# Patient Record
Sex: Female | Born: 1960 | Race: Black or African American | Hispanic: No | Marital: Single | Smoking: Current every day smoker
Health system: Southern US, Community
[De-identification: ages and names within clinical notes are randomized; demographics above are authoritative.]

## PROBLEM LIST (undated history)

## (undated) DIAGNOSIS — I10 Essential (primary) hypertension: Secondary | ICD-10-CM

## (undated) HISTORY — DX: Essential (primary) hypertension: I10

---

## 2001-09-20 ENCOUNTER — Emergency Department (HOSPITAL_COMMUNITY): Admission: EM | Admit: 2001-09-20 | Discharge: 2001-09-20 | Payer: Self-pay | Admitting: Emergency Medicine

## 2001-09-20 ENCOUNTER — Encounter: Payer: Self-pay | Admitting: Emergency Medicine

## 2004-03-23 ENCOUNTER — Emergency Department (HOSPITAL_COMMUNITY): Admission: EM | Admit: 2004-03-23 | Discharge: 2004-03-23 | Payer: Self-pay | Admitting: Emergency Medicine

## 2009-10-06 ENCOUNTER — Emergency Department (HOSPITAL_COMMUNITY): Admission: EM | Admit: 2009-10-06 | Discharge: 2009-10-06 | Payer: Self-pay | Admitting: Emergency Medicine

## 2014-07-17 ENCOUNTER — Emergency Department (HOSPITAL_COMMUNITY): Payer: No Typology Code available for payment source

## 2014-07-17 ENCOUNTER — Emergency Department (HOSPITAL_COMMUNITY)
Admission: EM | Admit: 2014-07-17 | Discharge: 2014-07-17 | Disposition: A | Discharge status: Home / Self Care | Payer: No Typology Code available for payment source | Attending: Emergency Medicine | Admitting: Emergency Medicine

## 2014-07-17 ENCOUNTER — Encounter (HOSPITAL_COMMUNITY): Payer: Self-pay | Admitting: Emergency Medicine

## 2014-07-17 DIAGNOSIS — Y998 Other external cause status: Secondary | ICD-10-CM | POA: Diagnosis not present

## 2014-07-17 DIAGNOSIS — M542 Cervicalgia: Secondary | ICD-10-CM

## 2014-07-17 DIAGNOSIS — S0990XA Unspecified injury of head, initial encounter: Secondary | ICD-10-CM | POA: Diagnosis not present

## 2014-07-17 DIAGNOSIS — Y92410 Unspecified street and highway as the place of occurrence of the external cause: Secondary | ICD-10-CM | POA: Insufficient documentation

## 2014-07-17 DIAGNOSIS — Z720 Tobacco use: Secondary | ICD-10-CM | POA: Diagnosis not present

## 2014-07-17 DIAGNOSIS — Y9389 Activity, other specified: Secondary | ICD-10-CM | POA: Diagnosis not present

## 2014-07-17 DIAGNOSIS — S3992XA Unspecified injury of lower back, initial encounter: Secondary | ICD-10-CM | POA: Diagnosis not present

## 2014-07-17 DIAGNOSIS — M545 Low back pain: Secondary | ICD-10-CM

## 2014-07-17 DIAGNOSIS — S199XXA Unspecified injury of neck, initial encounter: Secondary | ICD-10-CM | POA: Diagnosis not present

## 2014-07-17 MED ORDER — OXYCODONE-ACETAMINOPHEN 5-325 MG PO TABS
2.0000 | ORAL_TABLET | ORAL | Status: DC | PRN
Start: 2014-07-17 — End: 2014-07-19

## 2014-07-17 MED ORDER — NAPROXEN 500 MG PO TABS: 500.0000 mg | ORAL_TABLET | Freq: Two times a day (BID) | ORAL | Status: DC

## 2014-07-17 MED ORDER — METHOCARBAMOL 500 MG PO TABS: 500.0000 mg | ORAL_TABLET | Freq: Two times a day (BID) | ORAL | Status: DC

## 2014-07-17 MED ORDER — MORPHINE SULFATE 4 MG/ML IJ SOLN
4.0000 mg | Freq: Once | INTRAMUSCULAR | Status: AC
Start: 2014-07-17 — End: 2014-07-17
  Administered 2014-07-17: 4 mg via INTRAMUSCULAR
  Filled 2014-07-17: qty 1
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
## 2014-07-19 ENCOUNTER — Emergency Department (INDEPENDENT_AMBULATORY_CARE_PROVIDER_SITE_OTHER)
Admission: EM | Admit: 2014-07-19 | Discharge: 2014-07-19 | Disposition: A | Discharge status: Home / Self Care | Payer: Self-pay | Source: Home / Self Care | Attending: Emergency Medicine | Admitting: Emergency Medicine

## 2014-07-19 ENCOUNTER — Encounter (HOSPITAL_COMMUNITY): Payer: Self-pay | Admitting: Emergency Medicine

## 2014-07-19 DIAGNOSIS — S161XXA Strain of muscle, fascia and tendon at neck level, initial encounter: Secondary | ICD-10-CM

## 2014-07-19 DIAGNOSIS — M5441 Lumbago with sciatica, right side: Secondary | ICD-10-CM

## 2014-07-19 MED ORDER — HYDROCODONE-ACETAMINOPHEN 5-325 MG PO TABS
2.0000 | ORAL_TABLET | Freq: Once | ORAL | Status: AC
  Administered 2014-07-19: 2 via ORAL

## 2014-07-19 MED ORDER — OXYCODONE-ACETAMINOPHEN 5-325 MG PO TABS: 1.0000 | ORAL_TABLET | ORAL | Status: DC | PRN

## 2014-07-19 MED ORDER — HYDROCODONE-ACETAMINOPHEN 5-325 MG PO TABS
ORAL_TABLET | ORAL | Status: AC
  Filled 2014-07-19: qty 2

## 2014-07-19 MED ORDER — PREDNISONE 50 MG PO TABS: ORAL_TABLET | ORAL | Status: DC
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
## 2014-07-19 NOTE — Discharge Instructions (Signed)
I'm sorry you are feeling so bad. Keep taking the Robaxin and Naprosyn. Take prednisone daily for 5 days. Use the Percocet every 4 hours as needed for severe pain. Use your heating pad as needed. Be careful to not burn yourself. You should see improvement over the next 2-3 days. You will be stiff and sore for another 2 weeks. Follow-up as needed.
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
## 2014-08-31 ENCOUNTER — Encounter (HOSPITAL_COMMUNITY): Payer: Self-pay | Admitting: Family Medicine

## 2014-08-31 ENCOUNTER — Emergency Department (INDEPENDENT_AMBULATORY_CARE_PROVIDER_SITE_OTHER): Payer: Self-pay

## 2014-08-31 ENCOUNTER — Emergency Department (INDEPENDENT_AMBULATORY_CARE_PROVIDER_SITE_OTHER)
Admission: EM | Admit: 2014-08-31 | Discharge: 2014-08-31 | Disposition: A | Discharge status: Home / Self Care | Payer: Self-pay | Source: Home / Self Care | Attending: Family Medicine | Admitting: Family Medicine

## 2014-08-31 DIAGNOSIS — M25531 Pain in right wrist: Secondary | ICD-10-CM

## 2014-08-31 DIAGNOSIS — M5431 Sciatica, right side: Secondary | ICD-10-CM

## 2014-08-31 MED ORDER — KETOROLAC TROMETHAMINE 60 MG/2ML IM SOLN
60.0000 mg | Freq: Once | INTRAMUSCULAR | Status: AC
  Administered 2014-08-31: 60 mg via INTRAMUSCULAR

## 2014-08-31 MED ORDER — PREDNISONE 50 MG PO TABS: ORAL_TABLET | ORAL | Status: DC

## 2014-08-31 MED ORDER — METHOCARBAMOL 500 MG PO TABS: 500.0000 mg | ORAL_TABLET | Freq: Four times a day (QID) | ORAL | Status: DC | PRN

## 2014-08-31 MED ORDER — KETOROLAC TROMETHAMINE 60 MG/2ML IM SOLN
INTRAMUSCULAR | Status: AC
  Filled 2014-08-31: qty 2

## 2014-08-31 MED ORDER — DICLOFENAC SODIUM 75 MG PO TBEC: 75.0000 mg | DELAYED_RELEASE_TABLET | Freq: Two times a day (BID) | ORAL | Status: DC
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
## 2015-08-13 ENCOUNTER — Encounter: Payer: Self-pay | Admitting: Internal Medicine

## 2015-08-13 ENCOUNTER — Ambulatory Visit (INDEPENDENT_AMBULATORY_CARE_PROVIDER_SITE_OTHER): Payer: Self-pay | Admitting: Internal Medicine

## 2015-08-13 VITALS — BP 178/102 | HR 93 | Temp 98.5°F | Resp 20 | Ht 62.25 in | Wt 222.0 lb

## 2015-08-13 DIAGNOSIS — I10 Essential (primary) hypertension: Secondary | ICD-10-CM

## 2015-08-13 DIAGNOSIS — Z720 Tobacco use: Secondary | ICD-10-CM

## 2015-08-13 DIAGNOSIS — K029 Dental caries, unspecified: Secondary | ICD-10-CM

## 2015-08-13 DIAGNOSIS — M545 Low back pain, unspecified: Secondary | ICD-10-CM

## 2015-08-13 DIAGNOSIS — M778 Other enthesopathies, not elsewhere classified: Secondary | ICD-10-CM

## 2015-08-13 DIAGNOSIS — Z780 Asymptomatic menopausal state: Secondary | ICD-10-CM

## 2015-08-13 DIAGNOSIS — M67431 Ganglion, right wrist: Secondary | ICD-10-CM

## 2015-08-13 MED ORDER — METOPROLOL TARTRATE 25 MG PO TABS: 25.0000 mg | ORAL_TABLET | Freq: Two times a day (BID) | ORAL | 11 refills | Status: DC

## 2015-08-13 MED ORDER — NAPROXEN 500 MG PO TABS: 500.0000 mg | ORAL_TABLET | Freq: Two times a day (BID) | ORAL | 2 refills | Status: DC
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
## 2015-08-13 NOTE — Patient Instructions (Addendum)
Drink a glass of water before every meal Drink 6-8 glasses of water daily Eat three meals daily Eat a protein and healthy fat with every meal (eggs,fish, chicken, [place] and limit red meats) Eat 5 servings of vegetables daily, mix the colors Eat 2 servings of fruit daily with skin, if skin is edible Use smaller plates Put food/utensils down as you chew and swallow each bite Eat at a table with friends/family at least once daily, no TV Do not eat in front of the TV  Try Black Cohosh for hot flashes and night sweats.  Wear your wrist splint all the time for next 2 weeks.  When you come back for your labs and bp check, nothing but water after midnight.  Take your Metoprolol in the morning when you come in with water.  Call 1-800-QUITNOW to see about getting support through their program and to obtain nicotine gum. If not helpful, call [phone] at [facility] to go through [place]'s smoking cessation program.

## 2015-08-14 DIAGNOSIS — I10 Essential (primary) hypertension: Secondary | ICD-10-CM | POA: Insufficient documentation

## 2015-08-14 DIAGNOSIS — K029 Dental caries, unspecified: Secondary | ICD-10-CM | POA: Insufficient documentation

## 2015-08-14 DIAGNOSIS — M545 Low back pain, unspecified: Secondary | ICD-10-CM | POA: Insufficient documentation

## 2015-08-14 DIAGNOSIS — M67439 Ganglion, unspecified wrist: Secondary | ICD-10-CM | POA: Insufficient documentation

## 2015-08-14 DIAGNOSIS — Z720 Tobacco use: Secondary | ICD-10-CM | POA: Insufficient documentation

## 2015-08-14 DIAGNOSIS — M778 Other enthesopathies, not elsewhere classified: Secondary | ICD-10-CM | POA: Insufficient documentation

## 2015-08-14 DIAGNOSIS — Z780 Asymptomatic menopausal state: Secondary | ICD-10-CM | POA: Insufficient documentation

## 2015-08-30 ENCOUNTER — Other Ambulatory Visit: Payer: Self-pay | Admitting: Obstetrics and Gynecology

## 2015-08-30 DIAGNOSIS — Z1231 Encounter for screening mammogram for malignant neoplasm of breast: Secondary | ICD-10-CM

## 2015-09-05 ENCOUNTER — Ambulatory Visit: Payer: Self-pay | Admitting: Internal Medicine

## 2015-09-14 ENCOUNTER — Other Ambulatory Visit: Payer: Self-pay

## 2015-09-14 ENCOUNTER — Ambulatory Visit (HOSPITAL_COMMUNITY): Payer: Self-pay

## 2015-09-19 ENCOUNTER — Telehealth (HOSPITAL_COMMUNITY): Payer: Self-pay | Admitting: *Deleted

## 2015-09-19 NOTE — Telephone Encounter (Signed)
Telephoned patient at mobil number and [facility].

## 2015-10-16 ENCOUNTER — Ambulatory Visit: Payer: Self-pay | Admitting: Internal Medicine

## 2016-04-21 ENCOUNTER — Emergency Department (HOSPITAL_COMMUNITY): Payer: Self-pay

## 2016-04-21 ENCOUNTER — Encounter (HOSPITAL_COMMUNITY): Payer: Self-pay

## 2016-04-21 ENCOUNTER — Emergency Department (HOSPITAL_COMMUNITY)
Admission: EM | Admit: 2016-04-21 | Discharge: 2016-04-21 | Disposition: A | Discharge status: Home / Self Care | Payer: Self-pay | Attending: Emergency Medicine | Admitting: Emergency Medicine

## 2016-04-21 DIAGNOSIS — S161XXA Strain of muscle, fascia and tendon at neck level, initial encounter: Secondary | ICD-10-CM | POA: Insufficient documentation

## 2016-04-21 DIAGNOSIS — Y990 Civilian activity done for income or pay: Secondary | ICD-10-CM | POA: Insufficient documentation

## 2016-04-21 DIAGNOSIS — X500XXA Overexertion from strenuous movement or load, initial encounter: Secondary | ICD-10-CM | POA: Insufficient documentation

## 2016-04-21 DIAGNOSIS — Z79899 Other long term (current) drug therapy: Secondary | ICD-10-CM | POA: Insufficient documentation

## 2016-04-21 DIAGNOSIS — I10 Essential (primary) hypertension: Secondary | ICD-10-CM | POA: Insufficient documentation

## 2016-04-21 DIAGNOSIS — M546 Pain in thoracic spine: Secondary | ICD-10-CM | POA: Insufficient documentation

## 2016-04-21 DIAGNOSIS — F17210 Nicotine dependence, cigarettes, uncomplicated: Secondary | ICD-10-CM | POA: Insufficient documentation

## 2016-04-21 DIAGNOSIS — Y9289 Other specified places as the place of occurrence of the external cause: Secondary | ICD-10-CM | POA: Insufficient documentation

## 2016-04-21 DIAGNOSIS — Y9389 Activity, other specified: Secondary | ICD-10-CM | POA: Insufficient documentation

## 2016-04-21 MED ORDER — KETOROLAC TROMETHAMINE 30 MG/ML IJ SOLN
30.0000 mg | Freq: Once | INTRAMUSCULAR | Status: AC
  Administered 2016-04-21: 30 mg via INTRAMUSCULAR
  Filled 2016-04-21: qty 1

## 2016-04-21 MED ORDER — DIAZEPAM 5 MG/ML IJ SOLN
10.0000 mg | Freq: Once | INTRAMUSCULAR | Status: AC
  Administered 2016-04-21: 10 mg via INTRAMUSCULAR
  Filled 2016-04-21: qty 2

## 2016-04-21 MED ORDER — NAPROXEN 500 MG PO TABS
500.0000 mg | ORAL_TABLET | Freq: Three times a day (TID) | ORAL | 0 refills | Status: DC | PRN
Start: 2016-04-21 — End: 2021-07-25

## 2016-04-21 MED ORDER — DIAZEPAM 5 MG PO TABS: 5.0000 mg | ORAL_TABLET | Freq: Two times a day (BID) | ORAL | 0 refills | Status: DC | PRN

## 2016-04-21 NOTE — ED Triage Notes (Signed)
Pt states she has had back pain radiating into her neck and the back of her head X1 week. Pt denies injury. She reports she can't sleep at night due to the pain.

## 2016-04-21 NOTE — Discharge Instructions (Signed)
Take anti-inflammatory and muscle relaxant for likely muscle strain. Your X-rays looks good today. Continue heat backs and avoid heavy lifting/strenuous activity until symptoms improve.

## 2016-04-21 NOTE — ED Provider Notes (Signed)
[facility] DEPT Provider Note    By signing my name below, I, Earmon Phoenix, attest that this documentation has been prepared under the direction and in the presence of Lavera Guise, MD. Electronically Signed: Earmon Phoenix, ED Scribe. 04/21/16. 3:29 PM.    History   Chief Complaint Chief Complaint  Patient presents with  . Back Pain  . Neck Pain    The history is provided by the patient and medical records. No language interpreter was used.    Robin Mckee is an obese 56 y.o. female with PMHx of chronic low back pain, HTN who presents to the Emergency Department complaining of worsening posterior neck and upper back pain that began about 1.5 weeks ago. She reports associated radiating pain into her chest and head. Pt reports URI symptoms including nasal congestion, cough and rhinorrhea in the past week. She has taken Naproxen for pain with no significant relief. Pt states the pain is worse when she wakes in the morning and it is hard for her to start moving around. Movements and position changes increase her pain. She denies alleviating factors. She denies fever, chills, nausea, vomiting, numbness, tingling or weakness of any extremity. She denies trauma, injury or fall. She reports normally lifting up to 25 pounds at work but does not remember anything in particular that started the pain.   History reviewed. No pertinent past medical history.  Patient Active Problem List   Diagnosis Date Noted  . Tobacco abuse 08/14/2015  . Essential hypertension 08/14/2015  . Dental decay 08/14/2015  . Menopause 08/14/2015  . Low back pain 08/14/2015  . Ganglion cyst of wrist 08/14/2015  . Tendinitis of right wrist 08/14/2015    History reviewed. No pertinent surgical history.  OB History    Gravida Para Term Preterm AB Living   0 0 0 0 0 0   SAB TAB Ectopic Multiple Live Births   0 0 0 0 0       Home Medications    Prior to Admission medications   Medication Sig Start  Date End Date Taking? Authorizing Provider  metoprolol tartrate (LOPRESSOR) 25 MG tablet Take 1 tablet (25 mg total) by mouth 2 (two) times daily. 08/13/15  Yes Julieanne Manson, MD  Multiple Vitamin (MULTIVITAMIN WITH MINERALS) TABS tablet Take 1 tablet by mouth daily.   Yes Historical Provider, MD  naproxen (NAPROSYN) 500 MG tablet Take 1 tablet (500 mg total) by mouth 2 (two) times daily with a meal. 08/13/15  Yes Julieanne Manson, MD  diazepam (VALIUM) 5 MG tablet Take 1 tablet (5 mg total) by mouth 2 (two) times daily as needed for muscle spasms. 04/21/16   Lavera Guise, MD  naproxen (NAPROSYN) 500 MG tablet Take 1 tablet (500 mg total) by mouth 3 (three) times daily with meals as needed for mild pain or moderate pain. 04/21/16   Lavera Guise, MD    Family History Family History  Problem Relation Age of Onset  . Hypertension Other   . Hypertension Mother   . Stroke Mother     poorly controlled bp  . Hypertension Father   . Atrial fibrillation Father   . COPD Father   . Hypertension Sister   . Hypertension Sister     Social History Social History  Substance Use Topics  . Smoking status: Current Every Day Smoker    Packs/day: 0.10    Years: 3.40    Types: Cigarettes    Start date: 01/20/1981  . Smokeless  tobacco: Never Used     Comment: Has never tried anything before.  . Alcohol use 1.2 oz/week    2 Cans of beer per week     Comment: occ     Allergies   Patient has no known allergies.   Review of Systems Review of Systems 10/14 systems reviewed and are negative other than those stated in the HPI   Physical Exam Updated Vital Signs BP (!) 149/81   Pulse 82   Temp 98.9 F (37.2 C) (Oral)   Resp 17   LMP 07/08/2015 (Exact Date)   SpO2 98%   Physical Exam Physical Exam  Nursing note and vitals reviewed. Constitutional: Well developed, well nourished, non-toxic, and in no acute distress Head: Normocephalic and atraumatic.  Mouth/Throat: Oropharynx is clear and  moist.  Neck: Normal range of motion. Neck supple. Tenderness over right paraspinal muscles and over right trapezius. No midline cervical spine tenderness. Cardiovascular: Normal rate and regular rhythm.   Pulmonary/Chest: Effort normal and breath sounds normal.  Abdominal: Soft. There is no tenderness. There is no rebound and no guarding.  Musculoskeletal: Normal range of motion. Tenderness over upper thoracic paraspinal muscles on the right. Neurological: Alert, no facial droop, fluent speech, moves all extremities symmetrically. Full strength in bilateral upper and lower extremities. Sensation to light touch intact to upper and lower bilateral extremities. Skin: Skin is warm and dry.  Psychiatric: Cooperative   ED Treatments / Results  DIAGNOSTIC STUDIES: Oxygen Saturation is 98% on RA, normal by my interpretation.   COORDINATION OF CARE: 3:26 PM- Will order CXR. Will order injection of Toradol and Valium prior to imaging. Pt verbalizes understanding and agrees to plan.  Medications  ketorolac (TORADOL) 30 MG/ML injection 30 mg (30 mg Intramuscular Given 04/21/16 1552)  diazepam (VALIUM) injection 10 mg (10 mg Intramuscular Given 04/21/16 1552)    Labs (all labs ordered are listed, but only abnormal results are displayed) Labs Reviewed - No data to display  EKG  EKG Interpretation None       Radiology Dg Chest 2 View  Result Date: 04/21/2016 CLINICAL DATA:  Worsening neck and upper back pain for the past 1.5 weeks. EXAM: CHEST  2 VIEW COMPARISON:  None. FINDINGS: The lungs are clear. Heart size is normal. No pneumothorax pleural effusion. Aortic atherosclerosis noted. No acute bony abnormality. IMPRESSION: No acute disease. Electronically Signed   By: Drusilla Kanner M.D.   On: 04/21/2016 16:08    Procedures Procedures (including critical care time)  Medications Ordered in ED Medications  ketorolac (TORADOL) 30 MG/ML injection 30 mg (30 mg Intramuscular Given 04/21/16 1552)    diazepam (VALIUM) injection 10 mg (10 mg Intramuscular Given 04/21/16 1552)     Initial Impression / Assessment and Plan / ED Course  I have reviewed the triage vital signs and the nursing notes.  Pertinent labs & imaging results that were available during my care of the patient were reviewed by me and considered in my medical decision making (see chart for details).     With right sided neck pain and upper back pain, reproduced on palpation of the paraspinal muscles on the right side in the trapezius muscle. No overlying skin changes. Suspect likely muscle strain as she works in [employer] stage [employer] and does heavy lifting of 25 pound boxes on a routine basis. Feels feels improved after analgesics and muscle relaxants. X-rays visualized and does not show any acute cardiopulmonary processes, in the setting of her cough and upper respiratory symptoms.  To continue supportive care management for home. Strict return and follow-up instructions reviewed. She expressed understanding of all discharge instructions and felt comfortable with the plan of care.   Final Clinical Impressions(s) / ED Diagnoses   Final diagnoses:  Strain of neck muscle, initial encounter     New Prescriptions New Prescriptions   DIAZEPAM (VALIUM) 5 MG TABLET    Take 1 tablet (5 mg total) by mouth 2 (two) times daily as needed for muscle spasms.   NAPROXEN (NAPROSYN) 500 MG TABLET    Take 1 tablet (500 mg total) by mouth 3 (three) times daily with meals as needed for mild pain or moderate pain.   I personally performed the services described in this documentation, which was scribed in my presence. The recorded information has been reviewed and is accurate.    Lavera Guise, MD 04/21/16 [phone]

## 2018-05-31 IMAGING — DX DG CHEST 2V
2 series · 2 of 2 positions shown · non-contrast
Comparison: None.

CLINICAL DATA: Worsening neck and upper back pain for the past
weeks.

EXAM:
CHEST  2 VIEW

[w chest pa]
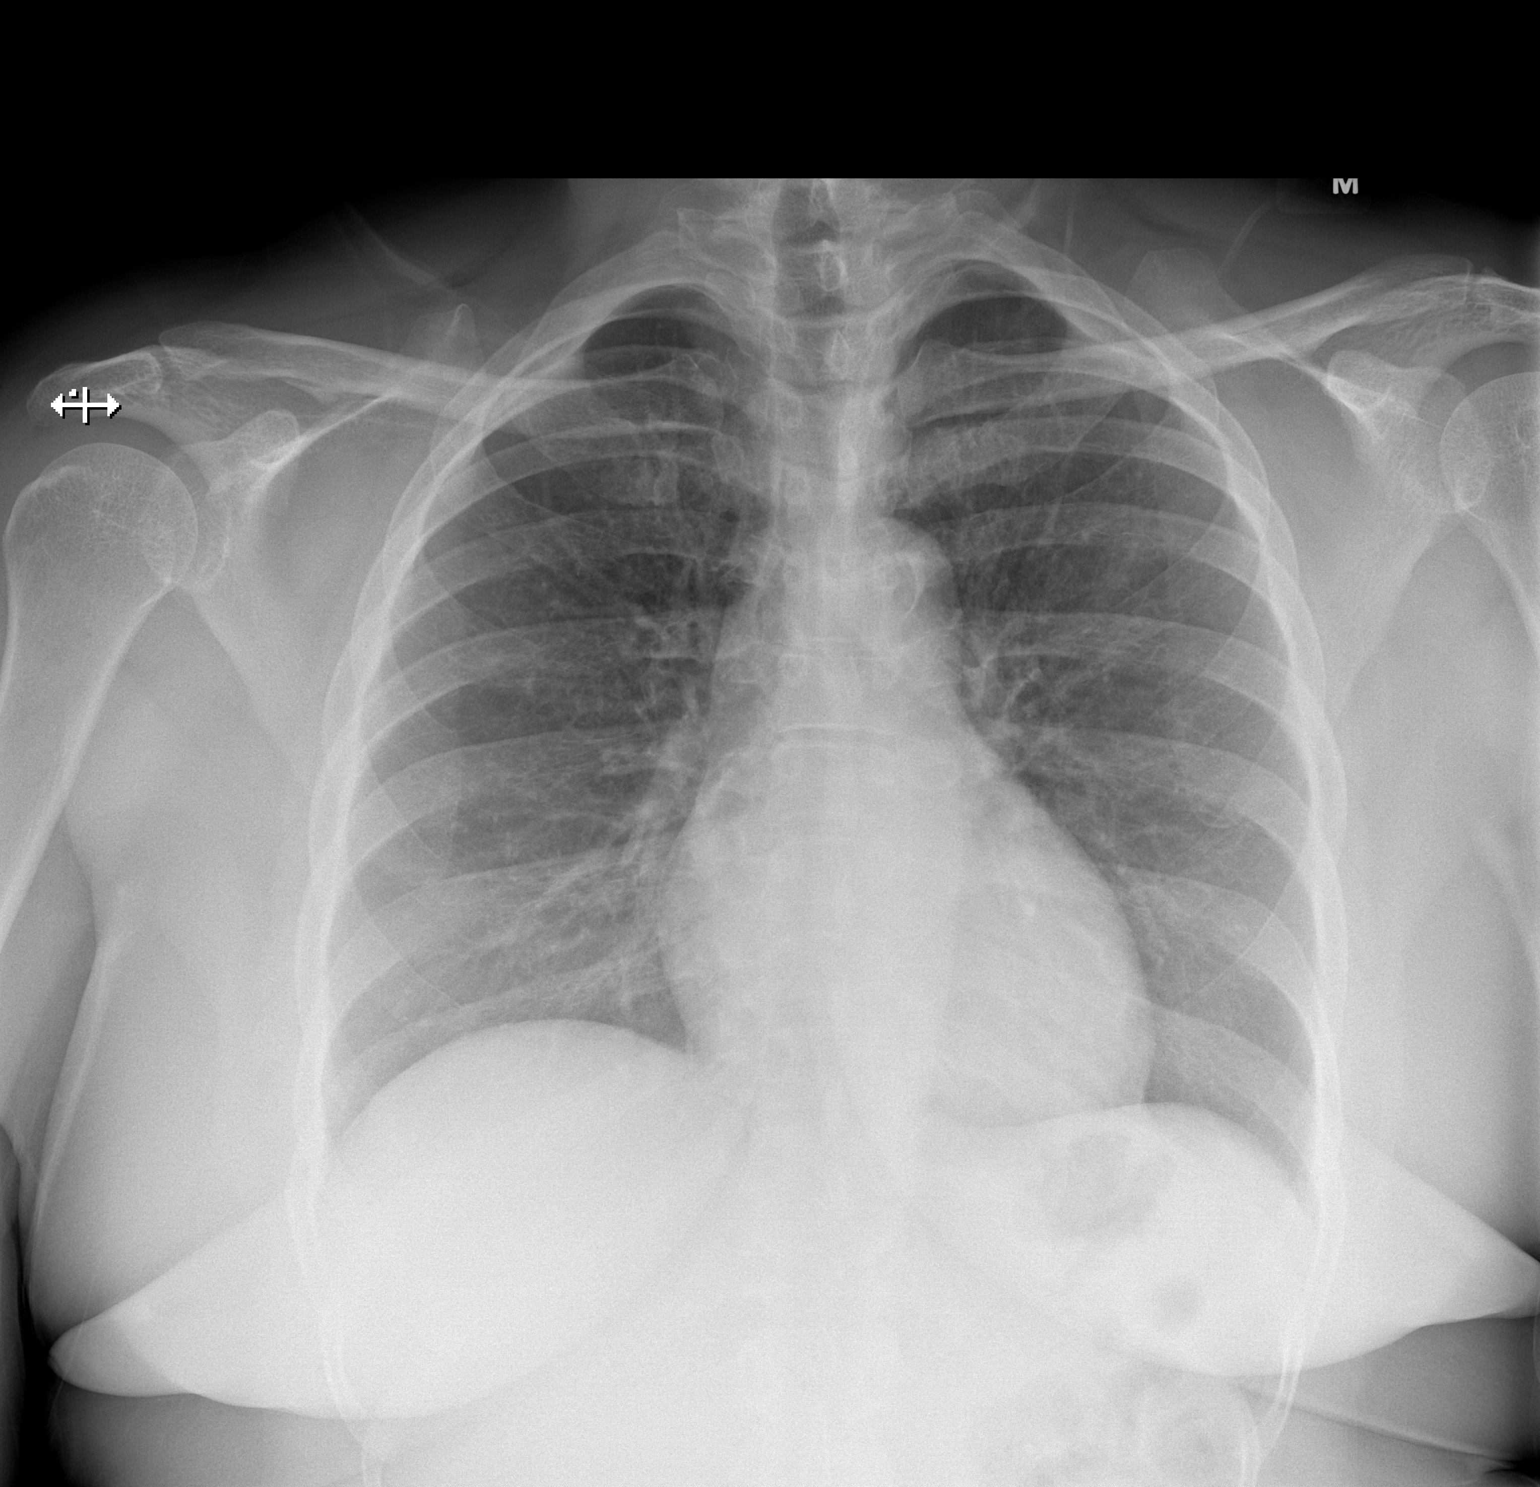

[w chest lat]
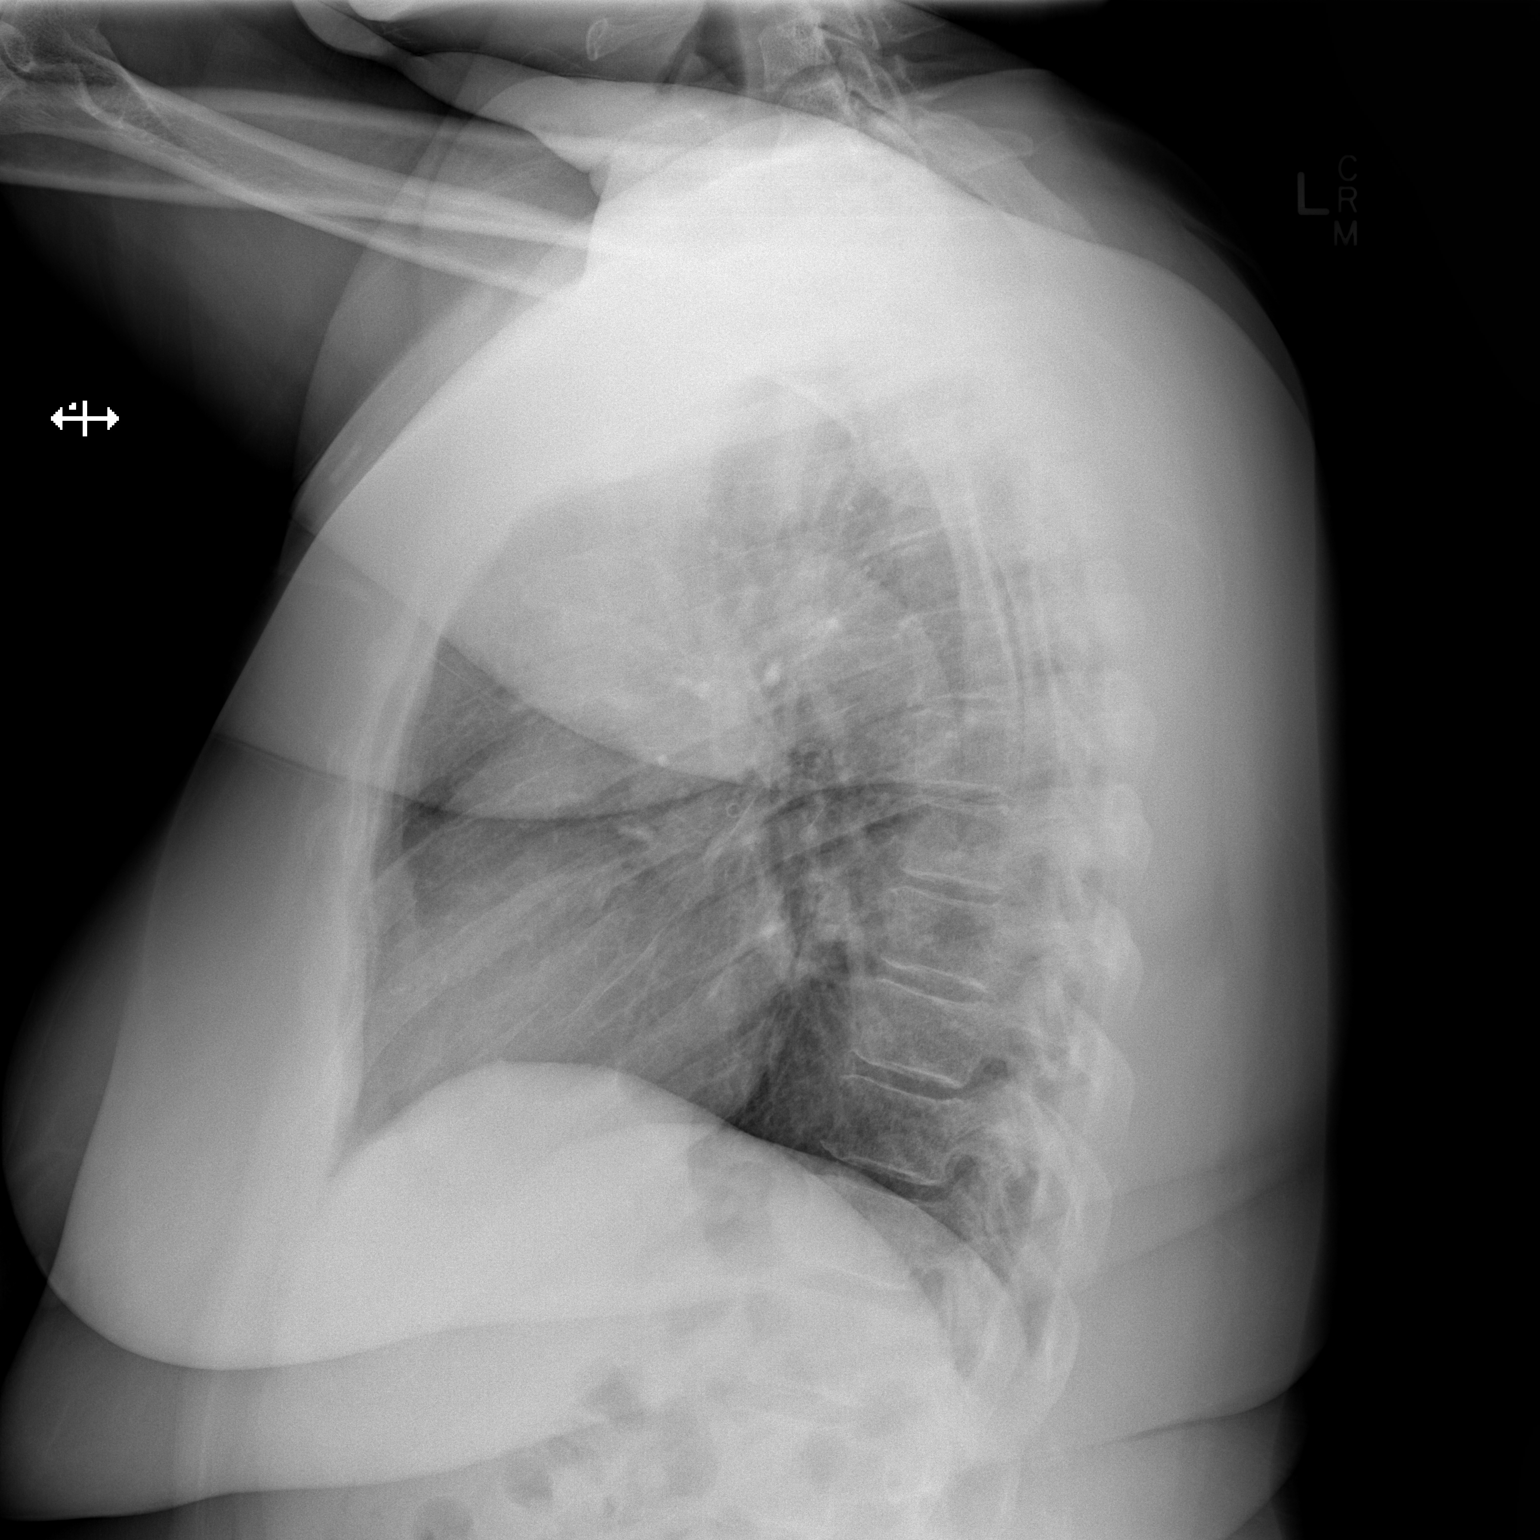

[2 of 2 positions shown; findings below may reference images not displayed]

FINDINGS: The lungs are clear. Heart size is normal. No pneumothorax pleural
effusion. Aortic atherosclerosis noted. No acute bony abnormality.
IMPRESSION: No acute disease.

## 2018-08-12 ENCOUNTER — Other Ambulatory Visit: Payer: Self-pay

## 2018-08-12 DIAGNOSIS — Z20822 Contact with and (suspected) exposure to covid-19: Secondary | ICD-10-CM

## 2018-08-16 LAB — NOVEL CORONAVIRUS, NAA: SARS-CoV-2, NAA: NOT DETECTED

## 2021-03-04 ENCOUNTER — Ambulatory Visit
Admission: EM | Admit: 2021-03-04 | Discharge: 2021-03-04 | Disposition: A | Discharge status: Home / Self Care | Payer: PRIVATE HEALTH INSURANCE | Attending: Physician Assistant | Admitting: Physician Assistant

## 2021-03-04 ENCOUNTER — Encounter: Payer: Self-pay | Admitting: Emergency Medicine

## 2021-03-04 ENCOUNTER — Other Ambulatory Visit: Payer: Self-pay

## 2021-03-04 DIAGNOSIS — J069 Acute upper respiratory infection, unspecified: Secondary | ICD-10-CM

## 2021-03-04 DIAGNOSIS — R112 Nausea with vomiting, unspecified: Secondary | ICD-10-CM

## 2021-03-04 DIAGNOSIS — R197 Diarrhea, unspecified: Secondary | ICD-10-CM

## 2021-03-04 MED ORDER — PROMETHAZINE-DM 6.25-15 MG/5ML PO SYRP: 5.0000 mL | ORAL_SOLUTION | Freq: Four times a day (QID) | ORAL | 0 refills | Status: DC | PRN

## 2021-03-04 NOTE — ED Provider Notes (Signed)
[facility]    CSN: [id number] Arrival date & time: 03/04/21  1440      History   Chief Complaint Chief Complaint  Patient presents with   Cough   Nausea    HPI Robin Mckee is a 61 y.o. female.   Patient here today for evaluation of cough, congestion, nausea, vomiting and diarrhea.  She reports that her nausea and vomiting have improved however diarrhea continues.  She is not any blood in her vomit or her stool.  She reports that cough has been troublesome.  She has not been able to sleep well due to cough.  She request cough syrup if possible.  She does not report fever.  The history is provided by the patient.  Cough Associated symptoms: no chills, no ear pain, no eye discharge, no fever, no shortness of breath, no sore throat and no wheezing    History reviewed. No pertinent past medical history.  Patient Active Problem List   Diagnosis Date Noted   Tobacco abuse 08/14/2015   Essential hypertension 08/14/2015   Dental decay 08/14/2015   Menopause 08/14/2015   Low back pain 08/14/2015   Ganglion cyst of wrist 08/14/2015   Tendinitis of right wrist 08/14/2015    History reviewed. No pertinent surgical history.  OB History     Gravida  0   Para  0   Term  0   Preterm  0   AB  0   Living  0      SAB  0   IAB  0   Ectopic  0   Multiple  0   Live Births  0            Home Medications    Prior to Admission medications   Medication Sig Start Date End Date Taking? Authorizing Provider  promethazine-dextromethorphan (PROMETHAZINE-DM) 6.25-15 MG/5ML syrup Take 5 mLs by mouth 4 (four) times daily as needed for cough. 03/04/21  Yes Tomi Bamberger, PA-C  diazepam (VALIUM) 5 MG tablet Take 1 tablet (5 mg total) by mouth 2 (two) times daily as needed for muscle spasms. 04/21/16   Lavera Guise, MD  metoprolol tartrate (LOPRESSOR) 25 MG tablet Take 1 tablet (25 mg total) by mouth 2 (two) times daily. 08/13/15   Julieanne Manson, MD   Multiple Vitamin (MULTIVITAMIN WITH MINERALS) TABS tablet Take 1 tablet by mouth daily.    [provider]  naproxen (NAPROSYN) 500 MG tablet Take 1 tablet (500 mg total) by mouth 2 (two) times daily with a meal. 08/13/15   Julieanne Manson, MD  naproxen (NAPROSYN) 500 MG tablet Take 1 tablet (500 mg total) by mouth 3 (three) times daily with meals as needed for mild pain or moderate pain. 04/21/16   Lavera Guise, MD    Family History Family History  Problem Relation Age of Onset   Hypertension Other    Hypertension Mother    Stroke Mother        poorly controlled bp   Hypertension Father    Atrial fibrillation Father    COPD Father    Hypertension Sister    Hypertension Sister     Social History Social History   Tobacco Use   Smoking status: Every Day    Packs/day: 0.10    Years: 3.40    Pack years: 0.34    Types: Cigarettes    Start date: 01/20/1981   Smokeless tobacco: Never   Tobacco comments:    Has  never tried anything before.  Substance Use Topics   Alcohol use: Yes    Alcohol/week: 2.0 standard drinks    Types: 2 Cans of beer per week    Comment: occ   Drug use: Yes    Frequency: 1.0 times per week    Types: Marijuana     Allergies   Patient has no known allergies.   Review of Systems Review of Systems  Constitutional:  Negative for chills and fever.  HENT:  Positive for congestion. Negative for ear pain and sore throat.   Eyes:  Negative for discharge and redness.  Respiratory:  Positive for cough. Negative for shortness of breath and wheezing.   Gastrointestinal:  Positive for diarrhea, nausea and vomiting. Negative for abdominal pain.    Physical Exam Triage Vital Signs ED Triage Vitals  Enc Vitals Group     BP 03/04/21 1538 (!) 160/87     Pulse Rate 03/04/21 1538 92     Resp 03/04/21 1538 16     Temp 03/04/21 1538 98.1 F (36.7 C)     Temp Source 03/04/21 1538 Oral     SpO2 03/04/21 1538 96 %     Weight --      Height --       Head Circumference --      Peak Flow --      Pain Score 03/04/21 1540 0     Pain Loc --      Pain Edu? --      Excl. in GC? --    No data found.  Updated Vital Signs BP (!) 160/87 (BP Location: Left Arm)    Pulse 92    Temp 98.1 F (36.7 C) (Oral)    Resp 16    LMP 07/08/2015 (Exact Date)    SpO2 96%      Physical Exam Vitals and nursing note reviewed.  Constitutional:      General: She is not in acute distress.    Appearance: Normal appearance. She is not ill-appearing.  HENT:     Head: Normocephalic and atraumatic.     Nose: Congestion present.     Mouth/Throat:     Mouth: Mucous membranes are moist.     Pharynx: No oropharyngeal exudate or posterior oropharyngeal erythema.  Eyes:     Conjunctiva/sclera: Conjunctivae normal.  Cardiovascular:     Rate and Rhythm: Normal rate and regular rhythm.     Heart sounds: Normal heart sounds. No murmur heard. Pulmonary:     Effort: Pulmonary effort is normal. No respiratory distress.     Breath sounds: Normal breath sounds. No wheezing, rhonchi or rales.  Skin:    General: Skin is warm and dry.  Neurological:     Mental Status: She is alert.  Psychiatric:        Mood and Affect: Mood normal.        Thought Content: Thought content normal.     UC Treatments / Results  Labs (all labs ordered are listed, but only abnormal results are displayed) Labs Reviewed  COVID-19, FLU A+B NAA    EKG   Radiology No results found.  Procedures Procedures (including critical care time)  Medications Ordered in UC Medications - No data to display  Initial Impression / Assessment and Plan / UC Course  I have reviewed the triage vital signs and the nursing notes.  Pertinent labs & imaging results that were available during my care of the patient were reviewed by me and considered  in my medical decision making (see chart for details).  Suspect likely viral etiology of symptoms.  Will screen for COVID, and Promethazine DM prescribed  for cough.  Recommend follow-up if symptoms fail to improve or worsen.   Final Clinical Impressions(s) / UC Diagnoses   Final diagnoses:  Acute upper respiratory infection  Nausea vomiting and diarrhea   Discharge Instructions   None    ED Prescriptions     Medication Sig Dispense Auth. Provider   promethazine-dextromethorphan (PROMETHAZINE-DM) 6.25-15 MG/5ML syrup Take 5 mLs by mouth 4 (four) times daily as needed for cough. 118 mL Tomi Bamberger, PA-C      PDMP not reviewed this encounter.   Tomi Bamberger, PA-C 03/04/21 [phone]

## 2021-03-04 NOTE — ED Triage Notes (Signed)
Cough, vomiting, diarrhea x 1 week

## 2021-03-05 LAB — COVID-19, FLU A+B NAA
Influenza A, NAA: NOT DETECTED
Influenza B, NAA: NOT DETECTED
SARS-CoV-2, NAA: NOT DETECTED

## 2021-07-25 ENCOUNTER — Ambulatory Visit (INDEPENDENT_AMBULATORY_CARE_PROVIDER_SITE_OTHER): Payer: 59 | Admitting: Student

## 2021-07-25 ENCOUNTER — Telehealth: Payer: Self-pay

## 2021-07-25 ENCOUNTER — Other Ambulatory Visit: Payer: Self-pay

## 2021-07-25 ENCOUNTER — Encounter: Payer: Self-pay | Admitting: Student

## 2021-07-25 VITALS — BP 180/85 | HR 69 | Temp 97.6°F | Ht 62.0 in | Wt 191.0 lb

## 2021-07-25 DIAGNOSIS — M65331 Trigger finger, right middle finger: Secondary | ICD-10-CM | POA: Diagnosis not present

## 2021-07-25 DIAGNOSIS — I10 Essential (primary) hypertension: Secondary | ICD-10-CM | POA: Diagnosis not present

## 2021-07-25 DIAGNOSIS — M79641 Pain in right hand: Secondary | ICD-10-CM

## 2021-07-25 DIAGNOSIS — F32A Depression, unspecified: Secondary | ICD-10-CM

## 2021-07-25 DIAGNOSIS — M545 Low back pain, unspecified: Secondary | ICD-10-CM

## 2021-07-25 DIAGNOSIS — G8929 Other chronic pain: Secondary | ICD-10-CM

## 2021-07-25 DIAGNOSIS — Z23 Encounter for immunization: Secondary | ICD-10-CM

## 2021-07-25 DIAGNOSIS — Z0000 Encounter for general adult medical examination without abnormal findings: Secondary | ICD-10-CM | POA: Insufficient documentation

## 2021-07-25 DIAGNOSIS — F17210 Nicotine dependence, cigarettes, uncomplicated: Secondary | ICD-10-CM

## 2021-07-25 MED ORDER — LOSARTAN POTASSIUM 25 MG PO TABS: 25.0000 mg | ORAL_TABLET | Freq: Every day | ORAL | 0 refills | Status: DC

## 2021-07-25 NOTE — Telephone Encounter (Signed)
   Telephone encounter was:  Successful.  07/25/2021 Name: Robin Mckee MRN: [id number] DOB: 06-22-1960  Robin Mckee is a 61 y.o. year old female who is a primary care patient of Pcp, No . The community resource team was consulted for assistance with Financial Difficulties related to bills/rent and Housing  Care guide performed the following interventions: Patient provided with information about care guide support team and interviewed to confirm resource needs.  Follow Up Plan:  Care guide will outreach resources to assist patient with housing and finances.  [facility] Guide, Embedded Care Coordination [facility]  [place], [place] [address]  Main Phone: [phone]  E-mail: [email or web address]  Website: [email or web address]
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
## 2021-07-25 NOTE — Assessment & Plan Note (Signed)
Patient presents to the clinic with a elevated blood pressure in the 180s.  On recheck it is still 180.  Patient is educated on the importance of good blood pressure control.  Patient states that she is not wanting to take medicine, but after further counseling she is agreeable to take some medicine.  Plan: - Patient is started on losartan 25 mg daily - Patient to keep a 2-week blood pressure log to present to me on follow-up in 2 weeks.

## 2021-07-25 NOTE — Progress Notes (Signed)
Internal Medicine Clinic Attending  I saw and evaluated the patient.  I personally confirmed the key portions of the history and exam documented by Dr. Allena Katz and I reviewed pertinent patient test results.  The assessment, diagnosis, and plan were formulated together and I agree with the documentation in the resident's note.   New patient here to establish care. She is hypertensive, nervous about starting new medicines, will start with Losartan 25mg  daily. She is depressed with PHQ9 of 13 today - politely declined medicines for depression, no SI She is experiencing homelessness - SW referral to help with housing & other community resources Many HCM labs & studies ordered today  Follow up with Dr. in 2 weeks

## 2021-07-25 NOTE — Assessment & Plan Note (Signed)
Patient reports chronic back pain at work.  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  She states that she sometimes has to lift heavy objects but is able to do so.  She takes ibuprofen as needed for this pain.  Patient is educated on proper use of ibuprofen.  Plan: - Patient is referred for outpatient physical therapy - Patient is instructed about lidocaine patches before work

## 2021-07-25 NOTE — Assessment & Plan Note (Addendum)
Patient reports that she has not seen a primary care physician in a very long time.  She states that she has not had healthcare maintenance in a very long time.  She notes that she is ready to establish care and start to take care of her health.  Patient also reports that she has been homeless since February 2023.  She states that she has been sleeping at her friends and families houses.  She also states having financial strain  Plan: - Patient is updated on her Tdap - Patient is referred to gastroenterology for screening colonoscopy - Patient is referred for mammogram - Patient will follow-up for her Pap smear -Patient was referred to our social worker in clinic - Patient is referred for dilated eye exam - Patient to get hepatitis C screening, HIV screening, CBC, CMP, A1c, and lipid panel

## 2021-07-25 NOTE — Addendum Note (Signed)
Addended by: Modena Slater on: 07/25/2021 01:04 PM   Modules accepted: Orders

## 2021-07-25 NOTE — Patient Instructions (Addendum)
Ms. Robin Mckee, thank you for allow me to take part in your care today here your instructions below:  1.  Regarding your recent struggles I understand that you do not want to take medication, but am going to give you this information regarding a therapist.  For counseling for depression please call [phone]. 2.  I referred you to gastroenterology for your colonoscopy, wait for phone call 3.  I referred you for a mammogram, wait for phone call 4.  I referred you to physical therapy for your back and right hand pain, wait for phone call. 5.  I referred you to social work for your housing and financial strains.  Please wait for phone call. 6.  Your blood pressure was very high today, I want you to keep a log for the next 2 weeks.  Please bring that log with you at your next follow-up appointment in 2 weeks. 7.  I prescribed you losartan 25 mg daily for your blood pressure.  Please take daily. 8.  You received your Tdap vaccine today.  You may feel some soreness for the next day.  If you have any other questions please call the internal medicine clinic at [phone]  Thank you, Dr. Allena Katz
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
## 2021-07-25 NOTE — Assessment & Plan Note (Addendum)
Patient scored a 13 on her PHQ-9 screening.  Patient reports that she has been feeling down since the passing of her father back in July 2022. She also reports feeling down since 10/08/22when her brother passed away.  Patient declines to start any medication for her depression but is agreeable to talk to a counselor.  Patient denies any suicidal ideation at this time.  Plan: - Patient is given phone number to [facility] medicine

## 2021-07-29 ENCOUNTER — Telehealth: Payer: Self-pay

## 2021-07-29 NOTE — Telephone Encounter (Signed)
   Telephone encounter was : Unsuccessful E-mail encounter was:  Successful.  07/29/2021 Name: Robin Mckee MRN: [id number] DOB: May 31, 1960  Robin Mckee is a 61 y.o. year old female who is a primary care patient of Pcp, No . The community resource team was consulted for assistance with Financial Difficulties related to bills/rent and Housing  Care guide performed the following interventions:  Sending Housing and Financial resources by e-mail per patients request. . E-mail enclosed: [facility] and Financial resources ([employer], Holiday representative, [employer], [employer], and DSS/[place])  Follow Up Plan:  Care guide will follow up with patient by phone over the next few days to ensure e-mail has been received.  [facility] Guide, Embedded Care Coordination [facility]  [place], [place] [address]  Main Phone: [phone]  E-mail: [email or web address]  Website: [email or web address]

## 2021-07-29 NOTE — Telephone Encounter (Signed)
   Telephone encounter was:   Successful.  07/29/2021 Name: Robin Mckee MRN: [id number] DOB: April 12, 1960  [name] is a 61 y.o. year old female who is a primary care patient of Pcp, No . The community resource team was consulted for assistance with Financial Difficulties related to bills/rent and housing  Care guide performed the following interventions:  Patient called to advised resources by e-mail have been received and at this time, she does not have any further needs at this time . Patient stated there's no further questions or concerns.   Follow Up Plan:  No further follow up planned at this time. The patient has been provided with needed resources.  [facility] Guide, Embedded Care Coordination [facility]  [place], [place] [address]  Main Phone: [phone]  E-mail: [email or web address]  Website: [email or web address]

## 2021-07-31 ENCOUNTER — Telehealth: Payer: Self-pay

## 2021-07-31 ENCOUNTER — Telehealth: Payer: Self-pay | Admitting: *Deleted

## 2021-07-31 NOTE — Chronic Care Management (AMB) (Signed)
  Care Coordination  Note  07/31/2021 Name: Robin Mckee MRN: [id number] DOB: 1960-02-21  Robin Mckee is a 61 y.o. year old female who is a primary care patient of Pcp, No. I reached out to [name] by phone today to offer care coordination services.       Follow up plan: Unsuccessful telephone outreach attempt made. A HIPAA compliant phone message was left for the patient providing contact information and requesting a return call.  If patient calls provider office to request assistance with care coordination needs, please contact the care guide at the number below.   [facility]  Care Coordination Care Guide  Direct Dial: [phone]

## 2021-07-31 NOTE — Chronic Care Management (AMB) (Signed)
  Care Coordination  Note  07/31/2021 Name: [name] MRN: [id number] DOB: 06-11-1960  Robin Mckee is a 61 y.o. year old female who is a primary care patient of Pcp, No. I reached out to [name] by phone today to offer care coordination services.      Ms. Quebedeaux was given information about Care Coordination services today including:  The Care Coordination services include support from the care team which includes your Nurse Coordinator, Clinical Social Worker, or Pharmacist.  The Care Coordination team is here to help remove barriers to the health concerns and goals most important to you. Care Coordination services are voluntary and the patient may decline or stop services at any time by request to their care team member.   Patient agreed to services and verbal consent obtained.   Follow up plan: Telephone appointment with care coordination team member scheduled for:08/02/21  [facility] Coordination Care Guide  Direct Dial: [phone]

## 2021-08-02 ENCOUNTER — Telehealth: Payer: Self-pay | Admitting: Licensed Clinical Social Worker

## 2021-08-02 ENCOUNTER — Encounter: Payer: Self-pay | Admitting: Licensed Clinical Social Worker

## 2021-08-02 NOTE — Patient Outreach (Signed)
[facility]) Care Management  08/02/2021  TREACY HOLCOMB 1960/01/24 [id number]  Unsuccessful outreach to patient on today. SW attempted at 9am and 11:20 am.   Christen Butter, Kenard Gower , MSW Social Worker [facility] Care Management  [phone]

## 2021-08-08 ENCOUNTER — Ambulatory Visit: Payer: Self-pay | Admitting: Licensed Clinical Social Worker

## 2021-08-08 ENCOUNTER — Encounter: Payer: 59 | Admitting: Student

## 2021-08-08 NOTE — Patient Outreach (Signed)
  Care Coordination   Initial Visit Note   08/08/2021 Name: Robin Mckee MRN: [id number] DOB: 01-Nov-1960  Robin Mckee is a 62 y.o. year old female who sees Modena Slater, DO for primary care. I spoke with  Robin Mckee by phone today  What matters to the patients health and wellness today?  Housing   Patient applied for housing. Patient awaiting approval for [employer]. SW contacted [employer] office manager and left VM.     SDOH assessments and interventions completed:   Yes SDOH Interventions Today    Flowsheet Row Most Recent Value  SDOH Interventions   Housing Interventions Other (Comment)  [Referred patient to [employer] and Housing Coallition]       Care Coordination Interventions Activated:  Yes Care Coordination Interventions:   Yes, provided  Follow up plan: Follow up call scheduled for within the next 30 days.   Encounter Outcome:  Pt. Visit Completed  Ander Gaster, MSW  Social Worker [facility] Care Management  [phone]

## 2021-08-08 NOTE — Patient Instructions (Signed)
Visit Information  Instructions: patient will work with SW to address concerns related to housing   Patient was given the following information about care management and care coordination services today, agreed to services, and gave verbal consent: 1.care management/care coordination services include personalized support from designated clinical staff supervised by their physician, including individualized plan of care and coordination with other care providers 2. 24/7 contact phone numbers for assistance for urgent and routine care needs. 3. The patient may stop care management/care coordination services at any time by phone call to the office staff.  Patient verbalizes understanding of instructions and care plan provided today and agrees to view in MyChart. Active MyChart status and patient understanding of how to access instructions and care plan via MyChart confirmed with patient.     The care management team will reach out to the patient again over the next 30 days.   Ander Gaster , MSW Social Worker [facility] Care Management  [phone]

## 2021-08-14 ENCOUNTER — Ambulatory Visit: Payer: 59 | Attending: Internal Medicine

## 2021-08-15 ENCOUNTER — Other Ambulatory Visit (HOSPITAL_COMMUNITY)
Admission: RE | Admit: 2021-08-15 | Discharge: 2021-08-15 | Disposition: A | Discharge status: Home / Self Care | Payer: 59 | Source: Ambulatory Visit | Attending: Internal Medicine | Admitting: Internal Medicine

## 2021-08-15 ENCOUNTER — Ambulatory Visit (INDEPENDENT_AMBULATORY_CARE_PROVIDER_SITE_OTHER): Payer: 59 | Admitting: Student

## 2021-08-15 VITALS — BP 175/75 | HR 79 | Wt 192.7 lb

## 2021-08-15 DIAGNOSIS — F17210 Nicotine dependence, cigarettes, uncomplicated: Secondary | ICD-10-CM

## 2021-08-15 DIAGNOSIS — I10 Essential (primary) hypertension: Secondary | ICD-10-CM | POA: Diagnosis not present

## 2021-08-15 DIAGNOSIS — E782 Mixed hyperlipidemia: Secondary | ICD-10-CM | POA: Diagnosis not present

## 2021-08-15 DIAGNOSIS — Z0000 Encounter for general adult medical examination without abnormal findings: Secondary | ICD-10-CM

## 2021-08-15 DIAGNOSIS — Z124 Encounter for screening for malignant neoplasm of cervix: Secondary | ICD-10-CM | POA: Insufficient documentation

## 2021-08-15 MED ORDER — LOSARTAN POTASSIUM 25 MG PO TABS: 25.0000 mg | ORAL_TABLET | Freq: Every day | ORAL | 11 refills | Status: DC

## 2021-08-15 MED ORDER — LOSARTAN POTASSIUM 25 MG PO TABS: 25.0000 mg | ORAL_TABLET | Freq: Every day | ORAL | 11 refills | Status: AC

## 2021-08-15 NOTE — Progress Notes (Addendum)
CC: Pap smear and labs  HPI:  Ms.Maimuna CIONNA COLLANTES is a 61 y.o. female with a past medical history of hypertension who is coming to the internal medicine center for Pap smear.  She also is coming in to get her labs drawn as she was unable to last visit.  Please see assessment and plan for full HPI  Past Medical History:  Diagnosis Date   HTN (hypertension)      Current Outpatient Medications:    ibuprofen (ADVIL) 200 MG tablet, Take 200 mg by mouth as needed. Back and Right handpain, Disp: , Rfl:    losartan (COZAAR) 25 MG tablet, Take 1 tablet (25 mg total) by mouth daily., Disp: 30 tablet, Rfl: 11  Review of Systems:    Constitutional: Patient denies any headache Eye: Patient denies any vision changes Respiratory: Patient denies any shortness of breath Cardiovascular: Patient denies any chest pain  Physical Exam:  Vitals:   08/15/21 1040  BP: (!) 175/75  Pulse: 79  SpO2: 100%  Weight: 192 lb 11.2 oz (87.4 kg)   General: Alert and orientated x3. Patient is sitting comfortably in the room  Eyes: EOM intact  Head: Normocephalic, atraumatic  Cardio: Regular rate and rhythm, no murmurs, rubs or gallops. 2+ pulses to bilateral upper and lower extremities  Pulmonary: Clear to ausculation bilaterally with no rales, rhonchi, and crackles   Pelvic Exam External genitalia unremarkable. Speculum exam with normal appearing whitish vaginal discharge. Vaginal wall mucosa is unremarkable. Cervix visualized and is unremarkable (closed in appearance without any protruding material).  Chaperon Maime and Dr. Lafonda Mosses present    Assessment & Plan:   Essential hypertension Patient presents to the clinic with elevated blood pressure in the 170s.  On recheck it is still in the 170s.  Patient is educated on good blood pressure control.  Patient reports that she did pick up her losartan 25 mg daily.  She states that she did not take it today.  She checks her blood pressure at home and it  runs her about 140s to 160s.  Patient requests a refill for her losartan which I will refill.  I encouraged her to continue measuring her blood pressure at home.  Plan: - Continue losartan 25 mg for the time being. - Check CMP to evaluate for kidney function, if kidney function is normal, will increase losartan to 50 mg. - Given her hypertension, I want to evaluate her risk for cardiac disease and will obtain A1c and lipid panel.  Addendum: - CMP within normal limits -A1c 5.5  Healthcare maintenance Patient is due for a Pap smear and is agreeable to get it done today.  Patient reports that her ophthalmologist called her and she is scheduled for an appointment on December.  She is still waiting for mammogram and colonoscopy.  Social work has been in contact with her.  She will continue to work with social work.  Patient is also agreeable to getting HIV screening and hepatitis C screening  Plan: -Hepatitis C and HIV screening pending  Addendum: - HIV screening negative - Hepatitis C screening negative  Screening for cervical cancer Patient is due for Pap smear.  She is agreeable to get Pap smear done today.  Plan: - Obtain Pap smear - Cytology pending  Hyperlipidemia Patient most recent lab result showing elevated total cholesterol 230.  LDL of 124.  The 10-year ASCVD risk score (Arnett DK, et al., 2019) is: 25.5%   Values used to calculate the score:  Age: 42 years     Sex: Female     Is Non-Hispanic African American: Yes     Diabetic: No     Tobacco smoker: Yes     Systolic Blood Pressure: 175 mmHg     Is BP treated: Yes     HDL Cholesterol: 94 mg/dL     Total Cholesterol: 230 mg/dL  Plan: - We will discuss statin therapy at next visit - Increase fiber intake, increase exercise, and decrease consumption of fried and fatty foods.   Patient seen with Dr.  Huntley Estelle, DO PGY-1 Internal Medicine Resident  Pager: [phone]

## 2021-08-15 NOTE — Assessment & Plan Note (Addendum)
Patient is due for Pap smear.  She is agreeable to get Pap smear done today.  Plan: - Obtain Pap smear - Cytology pending   Addendum: Pap showing evidence of trichomonas. Will plan to treat her and her partner with metronidazole 500 mg BID for the next 7 days.

## 2021-08-15 NOTE — Assessment & Plan Note (Addendum)
Patient presents to the clinic with elevated blood pressure in the 170s.  On recheck it is still in the 170s.  Patient is educated on good blood pressure control.  Patient reports that she did pick up her losartan 25 mg daily.  She states that she did not take it today.  She checks her blood pressure at home and it runs her about 140s to 160s.  Patient requests a refill for her losartan which I will refill.  I encouraged her to continue measuring her blood pressure at home.  Plan: - Continue losartan 25 mg for the time being. - Check CMP to evaluate for kidney function, if kidney function is normal, will increase losartan to 50 mg. - Given her hypertension, I want to evaluate her risk for cardiac disease and will obtain A1c and lipid panel.  Addendum: - CMP within normal limits -A1c 5.5

## 2021-08-15 NOTE — Assessment & Plan Note (Addendum)
Patient is due for a Pap smear and is agreeable to get it done today.  Patient reports that her ophthalmologist called her and she is scheduled for an appointment on December.  She is still waiting for mammogram and colonoscopy.  Social work has been in contact with her.  She will continue to work with social work.  Patient is also agreeable to getting HIV screening and hepatitis C screening  Plan: -Hepatitis C and HIV screening pending  Addendum: - HIV screening negative - Hepatitis C screening negative

## 2021-08-15 NOTE — Patient Instructions (Addendum)
Lelia, Jons you for allowing me to take part in your care today.  Here are your instructions.  1.  Your Pap smear has been done today.  Please await phone call for results.  2.  I have pulled blood work today including your liver function test, your liver function test, your cholesterol levels, A1c for diabetes check, and HIV and hepatitis C testing.  I will call you with the results.  3.  I refilled your losartan 25 mg blood pressure medication.  Please take this daily.  If changes need to be made, I will instruct you.  4.  Please follow-up in 1 month for blood pressure checkup   Thank you, Dr. Allena Katz  If you have any other questions please contact the internal medicine clinic at [phone]

## 2021-08-17 LAB — LIPID PANEL
Chol/HDL Ratio: 2.4 ratio (ref 0.0–4.4)
Cholesterol, Total: 230 mg/dL — ABNORMAL HIGH (ref 100–199)
HDL: 94 mg/dL (ref >39)
LDL Chol Calc (NIH): 124 mg/dL — ABNORMAL HIGH (ref 0–99)
Triglycerides: 68 mg/dL (ref 0–149)
VLDL Cholesterol Cal: 12 mg/dL (ref 5–40)

## 2021-08-17 LAB — CMP14 + ANION GAP
ALT: 21 IU/L (ref 0–32)
AST: 20 IU/L (ref 0–40)
Albumin/Globulin Ratio: 2 (ref 1.2–2.2)
Albumin: 4.2 g/dL (ref 3.8–4.9)
Alkaline Phosphatase: 91 IU/L (ref 44–121)
Anion Gap: 16 mmol/L (ref 10.0–18.0)
BUN/Creatinine Ratio: 18 (ref 12–28)
BUN: 15 mg/dL (ref 8–27)
Bilirubin Total: 0.4 mg/dL (ref 0.0–1.2)
CO2: 22 mmol/L (ref 20–29)
Calcium: 9.2 mg/dL (ref 8.7–10.3)
Chloride: 105 mmol/L (ref 96–106)
Creatinine, Ser: 0.83 mg/dL (ref 0.57–1.00)
Globulin, Total: 2.1 g/dL (ref 1.5–4.5)
Glucose: 86 mg/dL (ref 70–99)
Potassium: 4.8 mmol/L (ref 3.5–5.2)
Sodium: 143 mmol/L (ref 134–144)
Total Protein: 6.3 g/dL (ref 6.0–8.5)
eGFR: 81 mL/min/{1.73_m2} (ref >59)

## 2021-08-17 LAB — HCV INTERPRETATION

## 2021-08-17 LAB — HEMOGLOBIN A1C
Est. average glucose Bld gHb Est-mCnc: 111 mg/dL
Hgb A1c MFr Bld: 5.5 % (ref 4.8–5.6)

## 2021-08-17 LAB — HCV AB W REFLEX TO QUANT PCR: HCV Ab: NONREACTIVE

## 2021-08-17 LAB — HIV ANTIBODY (ROUTINE TESTING W REFLEX): HIV Screen 4th Generation wRfx: NONREACTIVE

## 2021-08-19 ENCOUNTER — Telehealth: Payer: Self-pay

## 2021-08-19 ENCOUNTER — Encounter: Payer: Self-pay | Admitting: Student

## 2021-08-19 DIAGNOSIS — E785 Hyperlipidemia, unspecified: Secondary | ICD-10-CM | POA: Insufficient documentation

## 2021-08-19 LAB — CYTOLOGY - PAP: Diagnosis: NEGATIVE

## 2021-08-19 NOTE — Progress Notes (Signed)
Internal Medicine Clinic Attending  I saw and evaluated the patient.  I personally confirmed the key portions of the history and exam documented by Dr. Allena Katz and I reviewed pertinent patient test results.  The assessment, diagnosis, and plan were formulated together and I agree with the documentation in the resident's note.    Agree with plan to start statin for elevated ASCVD risk

## 2021-08-19 NOTE — Assessment & Plan Note (Signed)
Patient most recent lab result showing elevated total cholesterol 230.  LDL of 124.  The 10-year ASCVD risk score (Arnett DK, et al., 2019) is: 25.5%   Values used to calculate the score:     Age: 61 years     Sex: Female     Is Non-Hispanic African American: Yes     Diabetic: No     Tobacco smoker: Yes     Systolic Blood Pressure: 175 mmHg     Is BP treated: Yes     HDL Cholesterol: 94 mg/dL     Total Cholesterol: 230 mg/dL  Plan: -Will start Atorvastatin 40 mg - Increase fiber intake, increase exercise, and decrease consumption of fried and fatty foods.

## 2021-08-19 NOTE — Telephone Encounter (Signed)
   Telephone encounter was:  Unsuccessful.  08/19/2021 Name: Robin Mckee MRN: [id number] DOB: November 18, 1960  Unsuccessful outbound call made today to assist with:  Transportation Needs   Outreach Attempt:  1st Attempt  A HIPAA compliant voice message was left requesting a return call.  Instructed patient to call back at [phone].  Mariadelcarmen Corella, AAS Paralegal, [facility] Care Guide  Embedded Care Coordination [place]  Care Management  [address] [place], [place] [address] ??[email or web address]  ?? [phone]   [email or web address]

## 2021-08-21 ENCOUNTER — Telehealth: Payer: Self-pay

## 2021-08-21 NOTE — Telephone Encounter (Signed)
   Telephone encounter was:  Successful.  08/21/2021 Name: JONIQUE KULIG MRN: [id number] DOB: [birth year]  EMBER HENRIKSON is a 61 y.o. year old female who is a primary care patient of Modena Slater, DO . The community resource team was consulted for assistance with Transportation Needs   Care guide performed the following interventions: Spoke with patient about [facility] and bus passes.   Follow Up Plan:  Patient has my name and contact number to call if she does not receive email. Letter saved in Epic.  Season Astacio, AAS Paralegal, [facility] Care Guide  Embedded Care Coordination [place]  Care Management  [address] [place], [place] [address] ??[email or web address]  ?? [phone]   [email or web address]

## 2021-08-22 ENCOUNTER — Encounter: Payer: Self-pay | Admitting: Student

## 2021-08-22 MED ORDER — ATORVASTATIN CALCIUM 40 MG PO TABS: 40.0000 mg | ORAL_TABLET | Freq: Every day | ORAL | 2 refills | Status: AC

## 2021-08-22 MED ORDER — METRONIDAZOLE 500 MG PO TABS: 500.0000 mg | ORAL_TABLET | Freq: Two times a day (BID) | ORAL | 0 refills | Status: AC

## 2021-08-22 NOTE — Addendum Note (Signed)
Addended by: Modena Slater on: 08/22/2021 01:20 PM   Modules accepted: Orders

## 2021-09-03 ENCOUNTER — Telehealth: Payer: Self-pay

## 2021-09-04 ENCOUNTER — Ambulatory Visit: Payer: Self-pay | Admitting: Licensed Clinical Social Worker

## 2021-09-04 ENCOUNTER — Encounter: Payer: Self-pay | Admitting: Licensed Clinical Social Worker

## 2021-09-04 NOTE — Patient Instructions (Signed)
Visit Information  Instructions: patient will work with SW to address concerns related to housing  Patient was given the following information about care management and care coordination services today, agreed to services, and gave verbal consent: 1.care management/care coordination services include personalized support from designated clinical staff supervised by their physician, including individualized plan of care and coordination with other care providers 2. 24/7 contact phone numbers for assistance for urgent and routine care needs. 3. The patient may stop care management/care coordination services at any time by phone call to the office staff.  Patient verbalizes understanding of instructions and care plan provided today and agrees to view in MyChart. Active MyChart status and patient understanding of how to access instructions and care plan via MyChart confirmed with patient.     The care management team will reach out to the patient again over the next 7 days.   Ander Gaster , MSW Social Worker [facility] Care Management  [phone]

## 2021-09-04 NOTE — Patient Outreach (Signed)
  Care Coordination   Follow Up Visit Note   09/04/2021 Name: LATRELL REITAN MRN: [id number] DOB: Nov 03, 1960  [name] is a 61 y.o. year old female who sees Modena Slater, DO for primary care. I spoke with  [name] by phone today  What matters to the patients health and wellness today?  Housing    Goals Addressed               This Visit's Progress     MSW Care Cooridination (pt-stated)        Patient request SW to contact apartment complex on 08/23. SW has scheduled call to advocate for patients placement.   Patient ready to leave current residence and temporarily be in shelter. SW discussed patient remaining in current housing. SW also educated patient on shelters [facility]).          SDOH assessments and interventions completed:  Yes     Care Coordination Interventions Activated:  Yes  Care Coordination Interventions:  Yes, provided   Follow up plan: Follow up call scheduled for 09/11/2021    Encounter Outcome:  Pt. Visit Completed   Ander Gaster , MSW Social Worker [facility] Care Management  [phone]

## 2021-09-06 ENCOUNTER — Encounter: Payer: Self-pay | Admitting: Licensed Clinical Social Worker

## 2021-09-06 ENCOUNTER — Ambulatory Visit: Payer: Self-pay | Admitting: Licensed Clinical Social Worker

## 2021-09-06 NOTE — Patient Outreach (Signed)
  Care Coordination   09/06/2021 Name: Robin Mckee MRN: [id number] DOB: 15-Feb-1960   Care Coordination Outreach Attempts:  An unsuccessful telephone outreach was attempted today to offer the patient information about available care coordination services as a benefit of their health plan.   Follow Up Plan:  Additional outreach attempts will be made to offer the patient care coordination information and services.   Encounter Outcome:  No Answer  Care Coordination Interventions Activated:  No   Care Coordination Interventions:  No, not indicated    Ander Gaster, MSW  Social Worker [facility] Care Management  [phone]

## 2021-09-12 ENCOUNTER — Ambulatory Visit: Payer: Self-pay | Admitting: Licensed Clinical Social Worker

## 2021-09-12 ENCOUNTER — Encounter: Payer: Self-pay | Admitting: Licensed Clinical Social Worker

## 2021-09-12 NOTE — Patient Outreach (Signed)
  Care Coordination   09/12/2021 Name: Robin Mckee MRN: [id number] DOB: 12-23-1960   Care Coordination Outreach Attempts:  An unsuccessful telephone outreach was attempted today to offer the patient information about available care coordination services as a benefit of their health plan.   Follow Up Plan:  Additional outreach attempts will be made to offer the patient care coordination information and services.   Encounter Outcome:  No Answer  Care Coordination Interventions Activated:  Yes   Care Coordination Interventions:  No, not indicated   Ander Gaster , MSW Social Worker [facility] Care Management  [phone]

## 2021-09-19 ENCOUNTER — Encounter: Payer: 59 | Admitting: Licensed Clinical Social Worker

## 2021-10-23 ENCOUNTER — Encounter: Payer: Self-pay | Admitting: *Deleted

## 2021-11-15 ENCOUNTER — Encounter: Payer: 59 | Admitting: Student

## 2021-12-03 ENCOUNTER — Encounter: Payer: 59 | Admitting: Student

## 2022-01-02 ENCOUNTER — Ambulatory Visit: Payer: Self-pay | Admitting: Licensed Clinical Social Worker

## 2022-01-02 NOTE — Patient Outreach (Signed)
SW removed self from care team.   Anyjah Roundtree, BSW, MSW, LCSW-A  Social Worker [facility] Care Management  [phone] 

## 2022-06-04 ENCOUNTER — Encounter: Payer: Self-pay | Admitting: Student

## 2022-09-23 ENCOUNTER — Encounter: Payer: Self-pay | Admitting: Student
